# Patient Record
Sex: Female | Born: 2005
Health system: Southern US, Community
[De-identification: ages and names within clinical notes are randomized; demographics above are authoritative.]

## PROBLEM LIST (undated history)

## (undated) DIAGNOSIS — L309 Dermatitis, unspecified: Secondary | ICD-10-CM

## (undated) DIAGNOSIS — T7840XA Allergy, unspecified, initial encounter: Secondary | ICD-10-CM

## (undated) DIAGNOSIS — K5900 Constipation, unspecified: Secondary | ICD-10-CM

## (undated) HISTORY — DX: Constipation, unspecified: K59.00

## (undated) HISTORY — DX: Dermatitis, unspecified: L30.9

## (undated) HISTORY — DX: Allergy, unspecified, initial encounter: T78.40XA

---
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
## 2024-05-03 ENCOUNTER — Encounter (HOSPITAL_BASED_OUTPATIENT_CLINIC_OR_DEPARTMENT_OTHER): Payer: Self-pay | Admitting: Emergency Medicine

## 2024-05-03 ENCOUNTER — Other Ambulatory Visit: Payer: Self-pay

## 2024-05-03 ENCOUNTER — Emergency Department (HOSPITAL_BASED_OUTPATIENT_CLINIC_OR_DEPARTMENT_OTHER): Admission: EM | Admit: 2024-05-03 | Discharge: 2024-05-03 | Disposition: A | Discharge status: Home / Self Care

## 2024-05-03 DIAGNOSIS — M546 Pain in thoracic spine: Secondary | ICD-10-CM | POA: Diagnosis not present

## 2024-05-03 MED ORDER — LIDOCAINE 5 % EX PTCH
1.0000 | MEDICATED_PATCH | Freq: Once | CUTANEOUS | Status: DC
  Administered 2024-05-03: 1 via TRANSDERMAL
  Filled 2024-05-03: qty 1

## 2024-05-03 MED ORDER — LIDOCAINE 5 % EX PTCH: 1.0000 | MEDICATED_PATCH | CUTANEOUS | 0 refills | Status: AC

## 2024-05-03 MED ORDER — METHOCARBAMOL 500 MG PO TABS: 500.0000 mg | ORAL_TABLET | Freq: Two times a day (BID) | ORAL | 0 refills | Status: AC | PRN

## 2024-05-03 NOTE — Discharge Instructions (Signed)
 Please take Tylenol  turning of ibuprofen  every 4 hours with dosages as directed on the packaging.  You may take the muscle relaxers and lidocaine  patches for further pain control.  Please follow-up with your primary doctor.  Return for fevers, chills, severe pain, numbness tingling changes in sensation in your lower extremity, bowel or bladder incontinence or any new or worsening symptoms that are concerning to you.

## 2024-05-03 NOTE — ED Provider Notes (Signed)
 [place] [facility] DEPARTMENT AT [facility] Provider Note   CSN: [id number] Arrival date & time: 05/03/24  1302     Patient presents with: Back Pain   Aniyla Harling is a 18 y.o. female.   This is a otherwise healthy 18 year old female presenting the emergency department for back pain.  Has had some back pain for the past couple weeks, seemingly worsened last night.  Hurts when she moves, better at rest.  No weakness or numbness in lower extremities.  No saddle anesthesia.  No fevers.  Not immunosuppressed.  No urinary symptoms.  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 Authorizing Provider  albuterol  (VENTOLIN  HFA) 108 (90 Base) MCG/ACT inhaler 2 puffs every 4-6 hours as needed coughing or wheezing. 11/14/23   Caswell Alstrom, MD  azithromycin  (ZITHROMAX ) 250 MG tablet 2 tabs by mouth on day #1, then 1 tab by mouth once a day on days 2-5. 11/14/23   Caswell Alstrom, MD  hydrocortisone  2.5 % cream Apply to the face area once a day only, sparingly and for only 3-5 days as needed for eczema. Patient not taking: Reported on 11/14/2023 12/21/19   Caswell Alstrom, MD  predniSONE  (DELTASONE ) 20 MG tablet 2 tabs by mouth once a day for 3 days. 11/14/23   Caswell Alstrom, MD    Allergies: Patient has no known allergies.    Review of Systems  Musculoskeletal:  Positive for back pain.    Updated Vital Signs BP 125/74   Pulse 75   Temp 98.2 F (36.8 C) (Oral)   Resp 15   Ht 5' 2 (1.575 m)   Wt 83.9 kg   LMP 05/01/2024 (Exact Date)   SpO2 100%   BMI 33.84 kg/m   Physical Exam Vitals and nursing note reviewed.  Constitutional:      General: She is not in acute distress.    Appearance: She is not toxic-appearing.  HENT:     Head: Normocephalic and atraumatic.     Nose: Nose normal.     Mouth/Throat:     Mouth: Mucous membranes are moist.   Eyes:     Conjunctiva/sclera: Conjunctivae normal.  Cardiovascular:     Rate and Rhythm: Normal rate and regular rhythm.  Pulmonary:     Effort: Pulmonary effort is normal.     Breath sounds: Normal breath sounds.  Abdominal:     General: Abdomen is flat. There is no distension.     Tenderness: There is no abdominal tenderness. There is no guarding or rebound.  Musculoskeletal:        General: Normal range of motion.     Comments: No midline spinal tenderness.  Some tenderness to the right thoracic paraspinal musculature.  5-5 plantarflexion dorsiflexion.  Skin:    General: Skin is warm and dry.     Capillary Refill: Capillary refill takes less than 2 seconds.  Neurological:     Mental Status: She is alert and oriented to person, place, and time.  Psychiatric:        Mood and Affect: Mood normal.        Behavior: Behavior normal.     (all labs ordered are listed, but only abnormal results are displayed) Labs Reviewed - No data to display  EKG: None  Radiology: No results found.   Procedures   Medications  Ordered in the ED  lidocaine  (LIDODERM ) 5 % 1 patch (has no administration in time range)                                    Medical Decision Making This is a 18 year old female presenting emergency department for back pain.  She is afebrile vital signs reassuring.  Physical exam without red flags.  Neurovascular intact.  Suspect muscular etiology/strain.  Discussed supportive care.  Will give lidocaine  patch here.  Discharged in stable condition  Amount and/or Complexity of Data Reviewed Labs:     Details: No red flags on history or physical.  Low suspicion for infectious process. Radiology:     Details: Considered imaging, however no red flags on history or physical exam.  Feel the risk of radiation exposure does not weigh the benefit at this time.  Risk Prescription drug management. Decision regarding hospitalization. Diagnosis or treatment significantly  limited by social determinants of health. Risk Details: Poor health literacy        Final diagnoses:  None    ED Discharge Orders     None          Neysa Caron PARAS, DO 05/03/24 1357

## 2024-05-03 NOTE — ED Triage Notes (Signed)
 Pt c/o mid back pain x 1 day. Denies urinary sx, paraesthesia, known injury.

## 2024-05-28 ENCOUNTER — Encounter: Payer: Self-pay | Admitting: *Deleted

## 2024-09-16 ENCOUNTER — Emergency Department (HOSPITAL_BASED_OUTPATIENT_CLINIC_OR_DEPARTMENT_OTHER): Admission: EM | Admit: 2024-09-16 | Discharge: 2024-09-16 | Disposition: A | Discharge status: Home / Self Care

## 2024-09-16 ENCOUNTER — Other Ambulatory Visit: Payer: Self-pay

## 2024-09-16 DIAGNOSIS — H6123 Impacted cerumen, bilateral: Secondary | ICD-10-CM | POA: Diagnosis present

## 2024-09-16 NOTE — ED Triage Notes (Signed)
 Pt from home via POV.  Pt reports ear pain on L side.  Pt states I can't really hear out of it.  RN able visualize large amount of cerumen in ear.  Pt denies recent cough or nasal congestion.

## 2024-09-16 NOTE — ED Provider Notes (Signed)
 " [facility] Provider Note   CSN: [id number] Arrival date & time: 09/16/24  2041     Patient presents with: Otalgia   Kathleen Beltran is a 19 y.o. female who presents today for cerumen impaction.  Patient states that she has been having increased fullness in both of her ears and is looking to have cerumen removal.  Patient denies any additional symptoms such as dizziness, drainage, fever, pain, nasal congestion, or sore throat.  Patient is in no acute distress.    Otalgia      Prior to Admission medications  Medication Sig Start Date End Date Taking? Authorizing Provider  albuterol  (VENTOLIN  HFA) 108 (90 Base) MCG/ACT inhaler 2 puffs every 4-6 hours as needed coughing or wheezing. 11/14/23   Caswell Alstrom, MD  azithromycin  (ZITHROMAX ) 250 MG tablet 2 tabs by mouth on day #1, then 1 tab by mouth once a day on days 2-5. 11/14/23   Caswell Alstrom, MD  hydrocortisone  2.5 % cream Apply to the face area once a day only, sparingly and for only 3-5 days as needed for eczema. Patient not taking: Reported on 11/14/2023 12/21/19   Caswell Alstrom, MD  lidocaine  (LIDODERM ) 5 % Place 1 patch onto the skin daily. Remove & Discard patch within 12 hours or as directed by MD 05/03/24   Neysa Caron PARAS, DO  methocarbamol  (ROBAXIN ) 500 MG tablet Take 1 tablet (500 mg total) by mouth 2 (two) times daily as needed for muscle spasms. 05/03/24   Neysa Caron PARAS, DO  predniSONE  (DELTASONE ) 20 MG tablet 2 tabs by mouth once a day for 3 days. 11/14/23   Caswell Alstrom, MD    Allergies: Patient has no known allergies.    Review of Systems  HENT:  Positive for ear pain.     Updated Vital Signs BP 125/80 (BP Location: Right Arm)   Pulse 84   Temp 99.4 F (37.4 C) (Oral)   Resp 18   Ht 5' 2 (1.575 m)   Wt 82.1 kg   LMP 08/27/2024 (Exact Date)   SpO2 100%   BMI 33.11 kg/m   Physical Exam Vitals and nursing note reviewed.  Constitutional:      General: She is not  in acute distress.    Appearance: Normal appearance.  HENT:     Head: Normocephalic and atraumatic.     Right Ear: Tympanic membrane and ear canal normal.     Left Ear: Tympanic membrane and ear canal normal.     Ears:     Comments: Post cerumen impaction: Clear external auditory canal with intact tympanic membranes bilaterally-no signs of perforation or infection.  Patient denies Eyes:     Extraocular Movements: Extraocular movements intact.     Conjunctiva/sclera: Conjunctivae normal.     Pupils: Pupils are equal, round, and reactive to light.  Cardiovascular:     Rate and Rhythm: Normal rate and regular rhythm.     Pulses: Normal pulses.  Pulmonary:     Effort: Pulmonary effort is normal. No respiratory distress.     Comments: Patient has no difficulty speaking in complete sentences. Musculoskeletal:        General: Normal range of motion.     Cervical back: Normal range of motion.  Skin:    General: Skin is warm and dry.     Capillary Refill: Capillary refill takes less than 2 seconds.  Neurological:     General: No focal deficit present.  Mental Status: She is alert. Mental status is at baseline.  Psychiatric:        Mood and Affect: Mood normal.     (all labs ordered are listed, but only abnormal results are displayed) Labs Reviewed - No data to display  EKG: None  Radiology: No results found.   Procedures   Medications Ordered in the ED - No data to display                                  Medical Decision Making  Patient presents to the ED for: Ear fullness bilaterally This involves an extensive number of treatment options  Differential diagnosis includes: Cerumen impaction Infectious etiology Co-morbid conditions: None  Management / Treatments: Ear lavage was performed by RN in triage prior to examination.   Reevaluation of the patient after these medicines showed that the patient improved. I have reviewed the patients home medicines and have  made adjustments as needed  ED Course / Reassessments: Problem List: Cerumen impaction 19 year old female presented for ear fullness. Initial assessment included history, physical exam, and review of prior medical records.  Physical examination of patient in triage by RN was remarkable for cerumen impaction in bilateral ear canals.  RN was instructed to complete ear lavage.  On assessment, patient states that she tolerated the ear lavage well and stated notable improvement in symptoms following the removal.  Exam demonstrated clear external auditory canal with intact membrane and no signs of perforation or infection.  No imaging or laboratory studies were indicated given the benign presentation and resolution of symptoms after intervention.  The patient was advised on routine ear hygiene and avoidance of cotton swabs or foreign body insertion into the ear canal.  Return precautions were discussed, including worsening pain, drainage, fever, hearing changes, or dizziness.  Patient response: improved  Disposition: Disposition: Discharge with close follow-up with PCP for further evaluation and care if symptoms persist  Rationale for disposition: stable for discharge   The disposition plan and rationale were discussed with the patient at the bedside, all questions were addressed, and the patient demonstrated understanding.  This note was produced using [employer]. While I have reviewed and verified all clinical information, transcription errors may remain.      Final diagnoses:  Bilateral impacted cerumen    ED Discharge Orders     None          Willma Duwaine CROME, [place] 09/17/24 1152    Ula Prentice SAUNDERS, MD 09/17/24 1514  "

## 2024-09-16 NOTE — Discharge Instructions (Signed)
 Thank you for visiting the Emergency Department today. It was a pleasure to be part of your healthcare team.   Your were seen today for earwax removal with success   As discussed, keep foreign objects out of your ears, follow-up with your PCP if cerumen impaction continues.  Thank you for trusting us  with your health.
